# Patient Record
Sex: Male | Born: 1970 | Race: Black or African American | Hispanic: No | Marital: Single | Smoking: Current every day smoker
Health system: Southern US, Community
[De-identification: ages and names within clinical notes are randomized; demographics above are authoritative.]

## PROBLEM LIST (undated history)

## (undated) DIAGNOSIS — I10 Essential (primary) hypertension: Secondary | ICD-10-CM

## (undated) HISTORY — PX: BACK SURGERY: SHX140

---

## 2001-04-26 ENCOUNTER — Inpatient Hospital Stay (HOSPITAL_COMMUNITY): Admission: RE | Admit: 2001-04-26 | Discharge: 2001-04-27 | Payer: Self-pay | Admitting: Neurosurgery

## 2001-04-26 ENCOUNTER — Encounter: Payer: Self-pay | Admitting: Neurosurgery

## 2012-07-19 DIAGNOSIS — S99929A Unspecified injury of unspecified foot, initial encounter: Secondary | ICD-10-CM | POA: Insufficient documentation

## 2012-07-19 DIAGNOSIS — F17200 Nicotine dependence, unspecified, uncomplicated: Secondary | ICD-10-CM | POA: Insufficient documentation

## 2012-07-19 DIAGNOSIS — S8990XA Unspecified injury of unspecified lower leg, initial encounter: Secondary | ICD-10-CM | POA: Insufficient documentation

## 2012-07-19 DIAGNOSIS — Y9289 Other specified places as the place of occurrence of the external cause: Secondary | ICD-10-CM | POA: Insufficient documentation

## 2012-07-19 DIAGNOSIS — X500XXA Overexertion from strenuous movement or load, initial encounter: Secondary | ICD-10-CM | POA: Insufficient documentation

## 2012-07-19 DIAGNOSIS — Y9389 Activity, other specified: Secondary | ICD-10-CM | POA: Insufficient documentation

## 2012-07-20 ENCOUNTER — Emergency Department (HOSPITAL_BASED_OUTPATIENT_CLINIC_OR_DEPARTMENT_OTHER)
Admission: EM | Admit: 2012-07-20 | Discharge: 2012-07-20 | Disposition: A | Discharge status: Home / Self Care | Payer: Worker's Compensation | Attending: Emergency Medicine | Admitting: Emergency Medicine

## 2012-07-20 ENCOUNTER — Emergency Department (HOSPITAL_BASED_OUTPATIENT_CLINIC_OR_DEPARTMENT_OTHER)
Admit: 2012-07-20 | Discharge: 2012-07-20 | Disposition: A | Discharge status: Home / Self Care | Payer: Worker's Compensation | Attending: Emergency Medicine | Admitting: Emergency Medicine

## 2012-07-20 ENCOUNTER — Encounter (HOSPITAL_BASED_OUTPATIENT_CLINIC_OR_DEPARTMENT_OTHER): Payer: Self-pay | Admitting: Emergency Medicine

## 2012-07-20 MED ORDER — OXYCODONE-ACETAMINOPHEN 5-325 MG PO TABS
1.0000 | ORAL_TABLET | Freq: Once | ORAL | Status: AC
  Administered 2012-07-20: 1 via ORAL
  Filled 2012-07-20 (×2): qty 1

## 2012-07-20 MED ORDER — HYDROCODONE-ACETAMINOPHEN 5-325 MG PO TABS: 1.0000 | ORAL_TABLET | Freq: Four times a day (QID) | ORAL | Status: AC | PRN

## 2012-07-20 NOTE — ED Provider Notes (Addendum)
History     CSN: [id number]  Arrival date & time 07/19/12  2359   First MD Initiated Contact with Patient 07/20/12 0127      Chief Complaint  Patient presents with  . Knee Pain    (Consider location/radiation/quality/duration/timing/severity/associated sxs/prior treatment) Patient is a 42 y.o. male presenting with knee pain. The history is provided by the patient. No language interpreter was used.  Knee Pain Location:  Knee Injury: yes   Mechanism of injury comment:  Twist getting out of the truck Knee location:  R knee Pain details:    Quality:  Burning   Radiates to:  Does not radiate   Severity:  Severe   Onset quality:  Gradual   Timing:  Constant   Progression:  Unchanged Chronicity:  New Dislocation: no   Foreign body present:  No foreign bodies Prior injury to area:  No Relieved by:  Nothing Worsened by:  Nothing tried Ineffective treatments:  None tried Associated symptoms: no numbness   Risk factors: no concern for non-accidental trauma     History reviewed. No pertinent past medical history.  History reviewed. No pertinent past surgical history.  No family history on file.  History  Substance Use Topics  . Smoking status: Current Every Day Smoker    Types: Cigars  . Smokeless tobacco: Never Used  . Alcohol Use: Yes     Comment: Occ      Review of Systems  All other systems reviewed and are negative.    Allergies  Review of patient's allergies indicates no known allergies.  Home Medications  No current outpatient prescriptions on file.  BP 146/79  Pulse 73  Temp(Src) 98.3 F (36.8 C) (Oral)  Resp 12  Ht 6\' 2"  (1.88 m)  Wt 250 lb (113.399 kg)  BMI 32.08 kg/m2  SpO2 100%  Physical Exam  Constitutional: He is oriented to person, place, and time. He appears well-developed and well-nourished. No distress.  HENT:  Head: Normocephalic and atraumatic.  Mouth/Throat: Oropharynx is clear and moist.  Eyes: Conjunctivae are normal. Pupils  are equal, round, and reactive to light.  Neck: Normal range of motion. Neck supple.  Cardiovascular: Normal rate, regular rhythm and intact distal pulses.   Pulmonary/Chest: Effort normal. He has no wheezes. He has no rales.  Abdominal: Soft. Bowel sounds are normal. There is no tenderness. There is no rebound and no guarding.  Musculoskeletal: Normal range of motion.  Negative anterior and posterior drawer test of the R knee.  No laxity to varus or valgus stress.  Able to do straight leg raise   Neurological: He is alert and oriented to person, place, and time. He has normal reflexes.  Skin: Skin is warm and dry.  Psychiatric: He has a normal mood and affect.    ED Course  Procedures (including critical care time)  Labs Reviewed - No data to display Dg Knee Complete 4 Views Right  07/20/2012  *RADIOLOGY REPORT*  Clinical Data: Fall, right knee pain  RIGHT KNEE - COMPLETE 4+ VIEW  Comparison: None  Findings: No displaced fracture or dislocation.  No significant degenerative change.  No definite joint effusion.  IMPRESSION: No acute osseous finding right knee.   Original Report Authenticated By: Jearld Lesch, M.D.      No diagnosis found.    MDM  Will treat with immobilizer.  Follow up with orthopedics         April K Palumbo-Rasch, MD 07/20/12 0139  April K Palumbo-Rasch, MD 07/20/12  0143 

## 2012-07-20 NOTE — ED Notes (Signed)
Missed a step getting out of a truck 3 days ago.  Having burning pain behind knee at times and pain with walking.

## 2012-10-27 ENCOUNTER — Other Ambulatory Visit: Payer: Self-pay | Admitting: General Practice

## 2012-10-27 ENCOUNTER — Ambulatory Visit
Admission: RE | Admit: 2012-10-27 | Discharge: 2012-10-27 | Disposition: A | Discharge status: Home / Self Care | Payer: Worker's Compensation | Source: Ambulatory Visit | Attending: General Practice | Admitting: General Practice

## 2012-10-27 DIAGNOSIS — R52 Pain, unspecified: Secondary | ICD-10-CM

## 2012-10-27 DIAGNOSIS — T1490XA Injury, unspecified, initial encounter: Secondary | ICD-10-CM

## 2019-01-10 ENCOUNTER — Emergency Department (HOSPITAL_COMMUNITY): Payer: BLUE CROSS/BLUE SHIELD

## 2019-01-10 ENCOUNTER — Emergency Department (HOSPITAL_COMMUNITY)
Admission: EM | Admit: 2019-01-10 | Discharge: 2019-01-10 | Discharge status: Against Medical Advice | Payer: BLUE CROSS/BLUE SHIELD | Attending: Emergency Medicine | Admitting: Emergency Medicine

## 2019-01-10 ENCOUNTER — Encounter (HOSPITAL_COMMUNITY): Payer: Self-pay

## 2019-01-10 ENCOUNTER — Other Ambulatory Visit: Payer: Self-pay

## 2019-01-10 DIAGNOSIS — Z5321 Procedure and treatment not carried out due to patient leaving prior to being seen by health care provider: Secondary | ICD-10-CM | POA: Insufficient documentation

## 2019-01-10 DIAGNOSIS — R0789 Other chest pain: Secondary | ICD-10-CM | POA: Diagnosis present

## 2019-01-10 DIAGNOSIS — R42 Dizziness and giddiness: Secondary | ICD-10-CM | POA: Insufficient documentation

## 2019-01-10 HISTORY — DX: Essential (primary) hypertension: I10

## 2019-01-10 LAB — BASIC METABOLIC PANEL
Anion gap: 11 (ref 5–15)
BUN: 13 mg/dL (ref 6–20)
CO2: 21 mmol/L — ABNORMAL LOW (ref 22–32)
Calcium: 9.3 mg/dL (ref 8.9–10.3)
Chloride: 104 mmol/L (ref 98–111)
Creatinine, Ser: 0.91 mg/dL (ref 0.61–1.24)
GFR calc Af Amer: >60 mL/min (ref >60)
GFR calc non Af Amer: >60 mL/min (ref >60)
Glucose, Bld: 100 mg/dL — ABNORMAL HIGH (ref 70–99)
Potassium: 4.8 mmol/L (ref 3.5–5.1)
Sodium: 136 mmol/L (ref 135–145)

## 2019-01-10 LAB — CBC
HCT: 40.5 % (ref 39.0–52.0)
Hemoglobin: 13.8 g/dL (ref 13.0–17.0)
MCH: 32.4 pg (ref 26.0–34.0)
MCHC: 34.1 g/dL (ref 30.0–36.0)
MCV: 95.1 fL (ref 80.0–100.0)
Platelets: 278 10*3/uL (ref 150–400)
RBC: 4.26 MIL/uL (ref 4.22–5.81)
RDW: 13 % (ref 11.5–15.5)
WBC: 6.4 10*3/uL (ref 4.0–10.5)
nRBC: 0 % (ref 0.0–0.2)

## 2019-01-10 LAB — TROPONIN I (HIGH SENSITIVITY)
Troponin I (High Sensitivity): 4 ng/L (ref <18)
Troponin I (High Sensitivity): 4 ng/L (ref <18)

## 2019-01-10 IMAGING — DX DG CHEST 2V
2 series · 2 of 2 positions shown · non-contrast
Comparison: None.

CLINICAL DATA: Chest pain

EXAM:
CHEST - 2 VIEW

[chest pa]
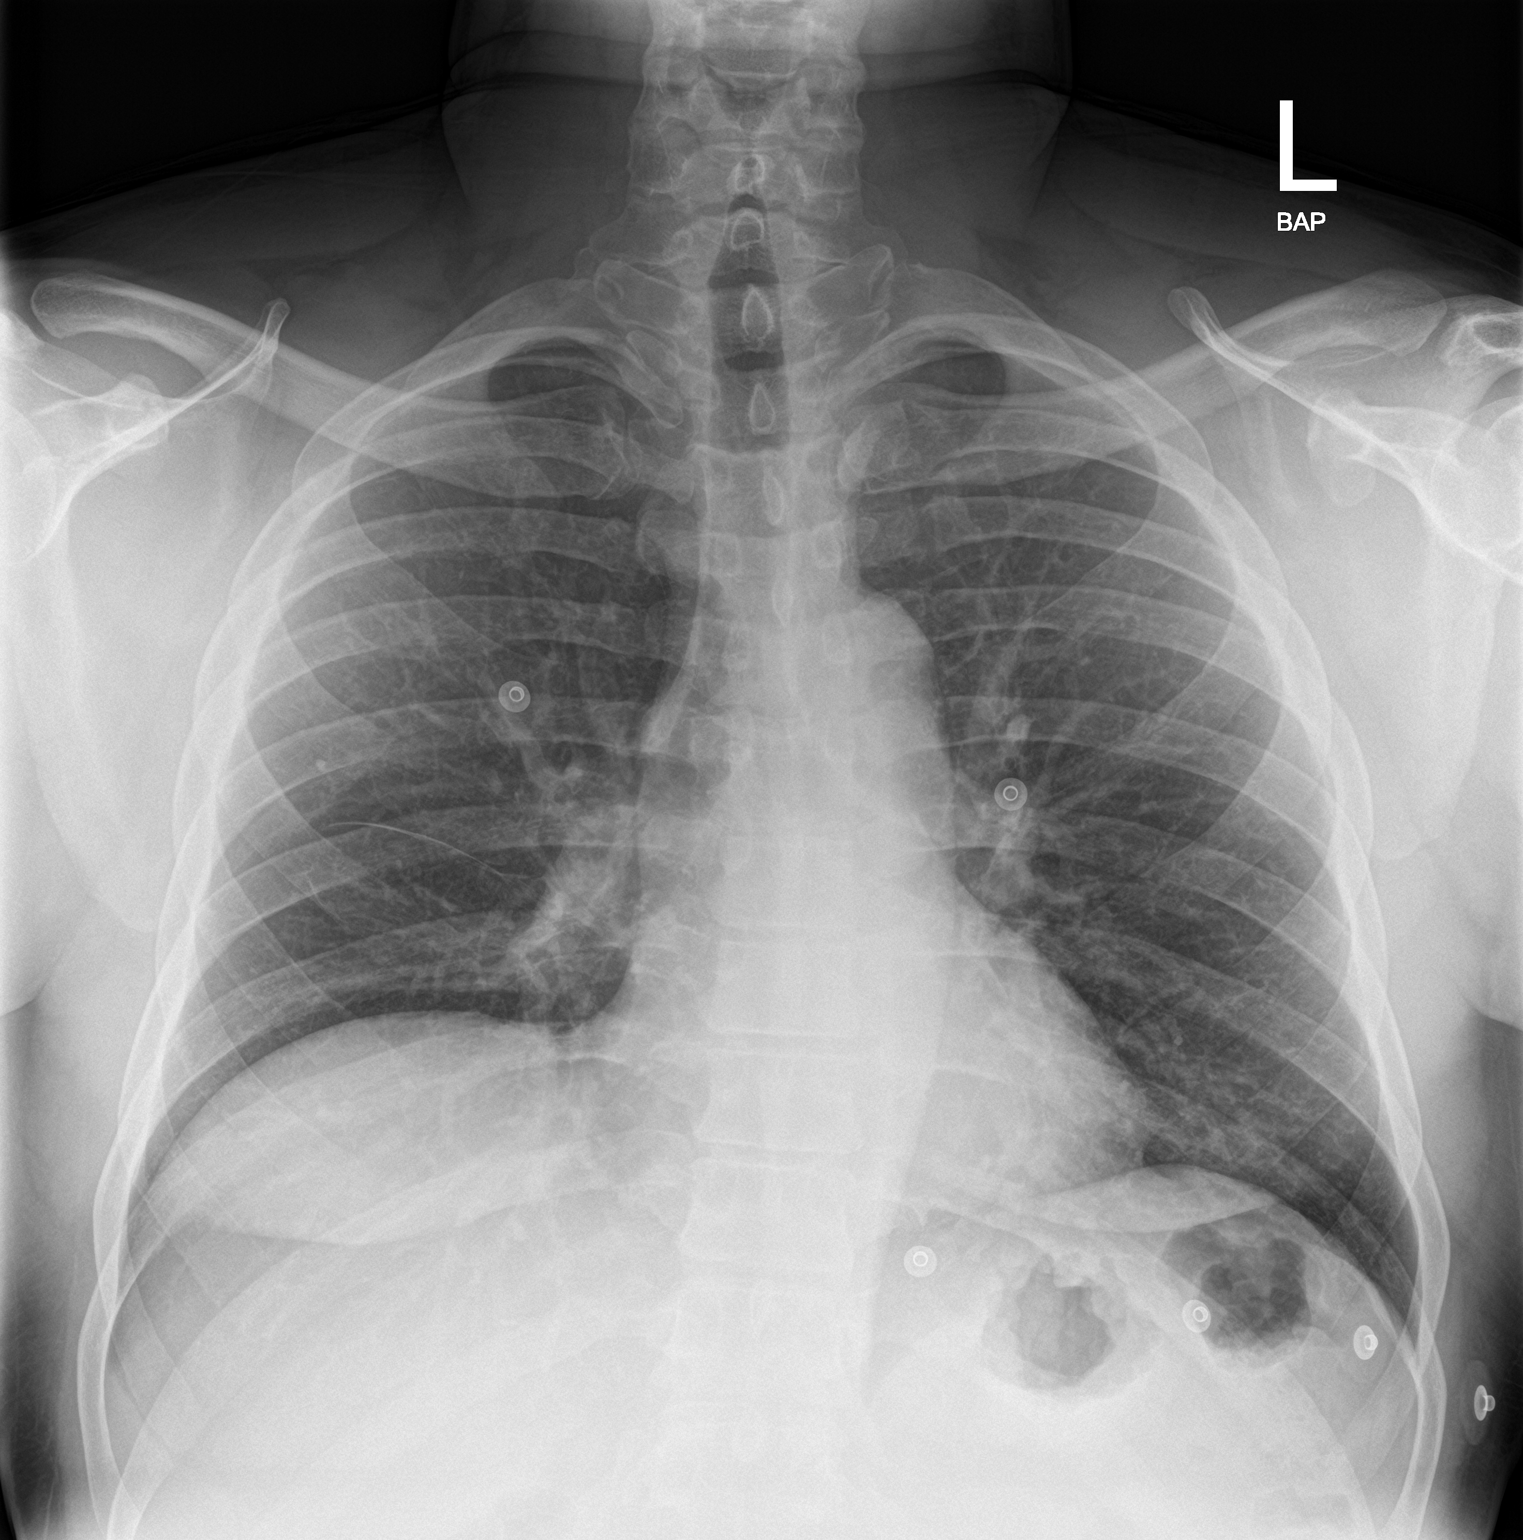

[chest lat]
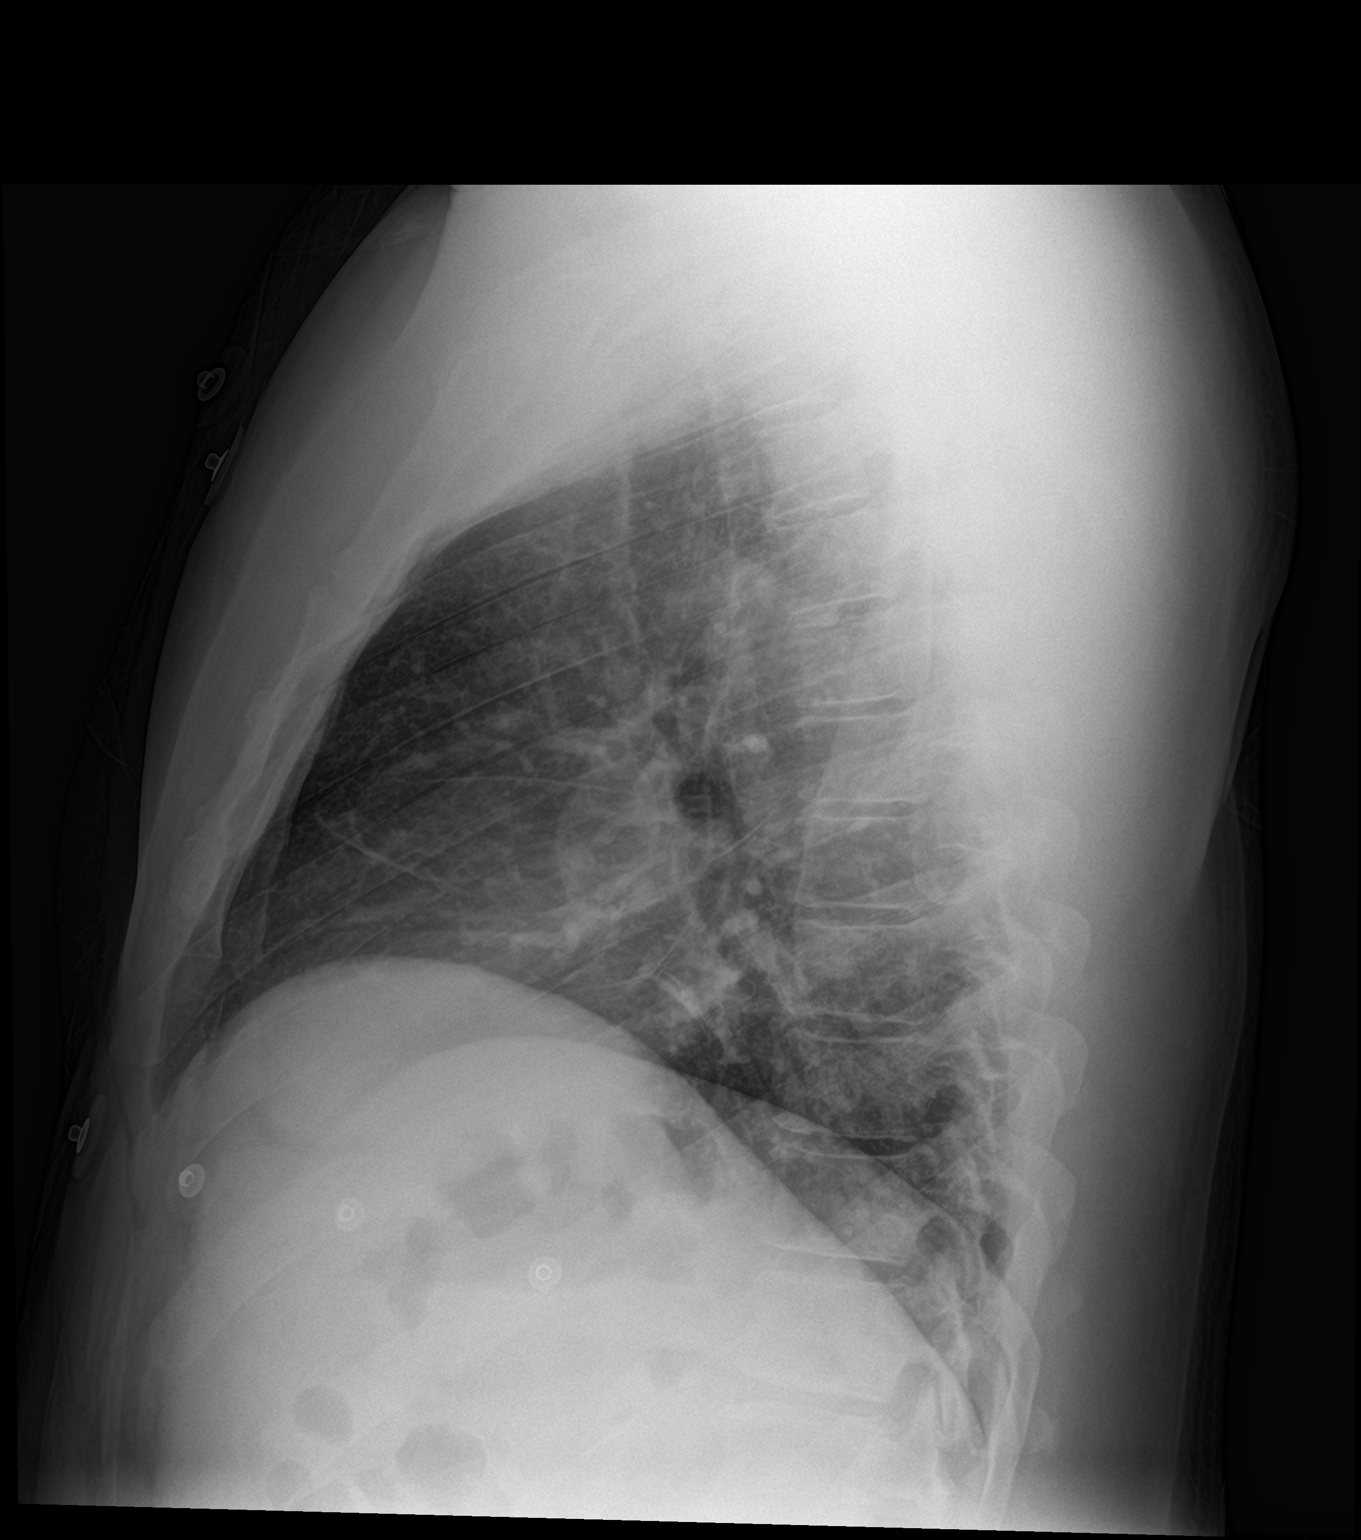

[2 of 2 positions shown; findings below may reference images not displayed]

FINDINGS: The heart size and mediastinal contours are within normal limits.
Both lungs are clear. The visualized skeletal structures are
unremarkable.
IMPRESSION: No acute abnormality of the lungs.

## 2019-01-10 MED ORDER — SODIUM CHLORIDE 0.9% FLUSH: 3.0000 mL | Freq: Once | INTRAVENOUS | Status: DC

## 2019-01-10 NOTE — ED Triage Notes (Signed)
Per [facility] pt was driving and pulled over at a fire station because he started having chest pain and dizziness. Pt felt like his blood pressure increased and he could not grip the steering wheel. Pt has a hx of hypertension and takes 3 different medications. Pt reports medications have not been helping.

## 2019-01-10 NOTE — ED Notes (Signed)
No answer for room 

## 2020-02-07 ENCOUNTER — Ambulatory Visit (INDEPENDENT_AMBULATORY_CARE_PROVIDER_SITE_OTHER): Payer: 59 | Admitting: Podiatry

## 2020-02-07 ENCOUNTER — Ambulatory Visit (INDEPENDENT_AMBULATORY_CARE_PROVIDER_SITE_OTHER): Payer: 59

## 2020-02-07 ENCOUNTER — Other Ambulatory Visit: Payer: Self-pay

## 2020-02-07 DIAGNOSIS — M2012 Hallux valgus (acquired), left foot: Secondary | ICD-10-CM

## 2020-02-07 DIAGNOSIS — Q6689 Other  specified congenital deformities of feet: Secondary | ICD-10-CM | POA: Diagnosis not present

## 2020-02-07 DIAGNOSIS — M7752 Other enthesopathy of left foot: Secondary | ICD-10-CM | POA: Diagnosis not present

## 2020-02-07 NOTE — Progress Notes (Signed)
BU

## 2020-02-07 NOTE — Progress Notes (Signed)
   Subjective: 49 y.o. male presents today as a new patient with his spouse for evaluation of pain to the left foot.  Patient states that he has had a bunion developing on his left foot for several years now.  Is become very symptomatic and he is unable to wear any shoe gear without irritating the area.  He also complains that his left second toe is longer than all the remaining digits.  It is very painful and is close toed shoes because it rubs against all of his shoes.  He does relate a history of metatarsal fractures to the left foot several years prior.  He presents for further treatment and evaluation   Past Medical History:  Diagnosis Date  . Hypertension       Objective: Physical Exam General: The patient is alert and oriented x3 in no acute distress.  Dermatology: Skin is cool, dry and supple bilateral lower extremities. Negative for open lesions or macerations.  Vascular: Palpable pedal pulses bilaterally. No edema or erythema noted. Capillary refill within normal limits.  Neurological: Epicritic and protective threshold grossly intact bilaterally.   Musculoskeletal Exam: Clinical evidence of bunion deformity noted to the respective foot. There is moderate pain on palpation range of motion of the first MPJ. Lateral deviation of the hallux noted consistent with hallux abductovalgus.  Morton's toe noted to the second digit left foot elongated further than the remaining digits  Radiographic Exam: Increased intermetatarsal angle greater than 15 with a hallux abductus angle greater than 30 noted on AP view. Moderate degenerative changes noted within the first MPJ.  Elongated second metatarsal noted extending past the metatarsal parabola as well as an enlarged elongated middle phalanx of the left second toe contributing to the Morton toe deformity  Assessment: 1. HAV w/ bunion deformity left 2.  Morton's toe/elongated second toe left   Plan of Care:  1. Patient was evaluated.  X-Rays reviewed. 2. Today we discussed the conservative versus surgical management of the presenting pathology. The patient opts for surgical management. All possible complications and details of the procedure were explained. All patient questions were answered. No guarantees were expressed or implied. 3. Authorization for surgery was initiated today. Surgery will consist of bunionectomy with first metatarsal osteotomy left.  Weil shortening metatarsal osteotomy second left.  DIPJ arthroplasty second left. 4.  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  Wife's name is Selena Batten who supervises quality control at a biotech company        Felecia Shelling, [facility]  Dr. Felecia Shelling, DPM    [address]                                        [place], [place] [address]                Office [phone]  Fax [phone]

## 2020-02-08 ENCOUNTER — Telehealth: Payer: Self-pay

## 2020-02-08 NOTE — Telephone Encounter (Signed)
DOS 03/01/2020  [place] BUNIONECTOMY LT - 28296 METATARSAL OSTEOTOMY 2ND LT - [id number] HAMMERTOE REPAIR 2ND LT - [id number]  RECEIVED AUTH APPROVAL FAX FROM [facility].   AUTH # [id number] GOOD FROM 03/01/2020 - 05/30/2020

## 2020-02-08 NOTE — Telephone Encounter (Signed)
Received surgery paperwork from the [place] office. Called and left a message for Michael Serrano to call me back so we can schedule surgery.

## 2020-02-10 HISTORY — PX: FOOT FRACTURE SURGERY: SHX645

## 2020-03-01 ENCOUNTER — Other Ambulatory Visit: Payer: Self-pay | Admitting: Podiatry

## 2020-03-01 DIAGNOSIS — M21549 Acquired clubfoot, unspecified foot: Secondary | ICD-10-CM | POA: Diagnosis not present

## 2020-03-01 DIAGNOSIS — M2042 Other hammer toe(s) (acquired), left foot: Secondary | ICD-10-CM

## 2020-03-01 DIAGNOSIS — M2012 Hallux valgus (acquired), left foot: Secondary | ICD-10-CM | POA: Diagnosis not present

## 2020-03-01 MED ORDER — OXYCODONE-ACETAMINOPHEN 5-325 MG PO TABS: 1.0000 | ORAL_TABLET | ORAL | 0 refills | Status: AC | PRN

## 2020-03-01 MED ORDER — IBUPROFEN 800 MG PO TABS: 800.0000 mg | ORAL_TABLET | Freq: Three times a day (TID) | ORAL | 1 refills | Status: AC

## 2020-03-01 NOTE — Progress Notes (Signed)
PRN postop 

## 2020-03-09 ENCOUNTER — Ambulatory Visit (INDEPENDENT_AMBULATORY_CARE_PROVIDER_SITE_OTHER): Payer: 59

## 2020-03-09 ENCOUNTER — Other Ambulatory Visit: Payer: Self-pay

## 2020-03-09 ENCOUNTER — Encounter: Payer: Self-pay | Admitting: Podiatry

## 2020-03-09 ENCOUNTER — Ambulatory Visit (INDEPENDENT_AMBULATORY_CARE_PROVIDER_SITE_OTHER): Payer: 59 | Admitting: Podiatry

## 2020-03-09 VITALS — BP 135/88 | HR 88 | Temp 98.9°F

## 2020-03-09 DIAGNOSIS — M2012 Hallux valgus (acquired), left foot: Secondary | ICD-10-CM

## 2020-03-09 DIAGNOSIS — Z9889 Other specified postprocedural states: Secondary | ICD-10-CM

## 2020-03-09 MED ORDER — DOXYCYCLINE HYCLATE 100 MG PO TABS: 100.0000 mg | ORAL_TABLET | Freq: Two times a day (BID) | ORAL | 0 refills | Status: AC

## 2020-03-09 NOTE — Progress Notes (Signed)
   Subjective:  Patient presents today status post bunionectomy and hammertoe repair second digit left foot. DOS: 03/01/2020.  Patient states that initially he did have some pain with itching because of the pain medication however it is resolved.  Currently he is not taking any pain medication has minimal pain.  He is kept the dressings clean dry and intact and is minimally weightbearing in the cam boot as instructed.  No new complaints at this time  Past Medical History:  Diagnosis Date  . Hypertension       Objective/Physical Exam Neurovascular status intact.  Skin incisions appear to be well coapted with sutures and staples intact. No dehiscence. No active bleeding noted. Moderate edema noted to the surgical extremity.  There are some very mild erythema localized to the forefoot which may be concerning for some mild cellulitis  Radiographic Exam:  Orthopedic hardware and osteotomies sites appear to be stable with routine healing.  Assessment: 1. s/p bunionectomy with DIPJ arthroplasty and Weil shortening osteotomy second digit left. DOS: 03/01/2020   Plan of Care:  1. Patient was evaluated. X-rays reviewed 2.  Prescription for doxycycline 100 mg 2 times daily #20 3.  Dressel dressings applied.  Keep clean dry and intact x1 week.  Minimal weightbearing in the cam boot 4.  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  Wife's name is Selena Batten who supervises quality control at a biotech company   Felecia Shelling, [facility]  Dr. Felecia Shelling, DPM    [address] [place], [place] [address]                Office [phone]  Fax [phone]

## 2020-03-16 ENCOUNTER — Encounter: Payer: Self-pay | Admitting: Podiatry

## 2020-03-16 ENCOUNTER — Ambulatory Visit (INDEPENDENT_AMBULATORY_CARE_PROVIDER_SITE_OTHER): Payer: 59 | Admitting: Podiatry

## 2020-03-16 ENCOUNTER — Other Ambulatory Visit: Payer: Self-pay

## 2020-03-16 DIAGNOSIS — M2012 Hallux valgus (acquired), left foot: Secondary | ICD-10-CM

## 2020-03-16 DIAGNOSIS — Q6689 Other  specified congenital deformities of feet: Secondary | ICD-10-CM

## 2020-03-16 DIAGNOSIS — Z9889 Other specified postprocedural states: Secondary | ICD-10-CM

## 2020-03-16 NOTE — Progress Notes (Signed)
   Subjective:  Patient presents today status post bunionectomy and hammertoe repair second digit left foot. DOS: 03/01/2020.  Patient states that he is feeling well.  No new complaints at this time  Past Medical History:  Diagnosis Date  . Hypertension       Objective/Physical Exam Neurovascular status intact.  Skin incisions appear to be well coapted with sutures intact. No dehiscence. No active bleeding noted. Moderate edema noted to the surgical extremity.  There are some very mild erythema localized to the forefoot which may be concerning for some mild cellulitis   Assessment: 1. s/p bunionectomy with DIPJ arthroplasty and Weil shortening osteotomy second digit left. DOS: 03/01/2020   Plan of Care:  1. Patient was evaluated.  2.  Continue oral doxycycline until completed.   3.  Dressings applied.  Keep clean dry and intact x1 week.  Minimal weightbearing in the cam boot 4.  Return to clinic in 1 week for suture removal.  The patient requested not to have the sutures removed today.  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  Wife's name is Selena Batten who supervises quality control at a biotech company   Felecia Shelling, [facility]  Dr. Felecia Shelling, DPM    [address] [place], [place] [address]                Office [phone]  Fax [phone]

## 2020-03-23 ENCOUNTER — Other Ambulatory Visit: Payer: Self-pay

## 2020-03-23 ENCOUNTER — Ambulatory Visit (INDEPENDENT_AMBULATORY_CARE_PROVIDER_SITE_OTHER): Payer: 59 | Admitting: Podiatry

## 2020-03-23 ENCOUNTER — Ambulatory Visit (INDEPENDENT_AMBULATORY_CARE_PROVIDER_SITE_OTHER): Payer: 59

## 2020-03-23 DIAGNOSIS — M2012 Hallux valgus (acquired), left foot: Secondary | ICD-10-CM

## 2020-03-23 DIAGNOSIS — Z9889 Other specified postprocedural states: Secondary | ICD-10-CM | POA: Diagnosis not present

## 2020-03-23 NOTE — Progress Notes (Signed)
   Subjective:  Patient presents today status post bunionectomy and hammertoe repair second digit left foot. DOS: 03/01/2020.  Patient states that he is feeling well.  No new complaints at this time  Past Medical History:  Diagnosis Date  . Hypertension       Objective/Physical Exam Neurovascular status intact.  Skin incisions appear to be well coapted with sutures intact. No dehiscence. No active bleeding noted. Moderate edema noted to the surgical extremity.  There are some very mild erythema localized to the forefoot which may be concerning for some mild cellulitis   Assessment: 1. s/p bunionectomy with DIPJ arthroplasty and Weil shortening osteotomy second digit left. DOS: 03/01/2020   Plan of Care:  1. Patient was evaluated.  2.  Sutures removed today 3.  Continue weightbearing in the cam boot x2 additional weeks.  Continue Ace wraps daily beginning 04/01/2020 he may begin to transition out of the cam boot into good supportive sneakers 4.  Return to clinic in 2 weeks for follow-up x-ray and to transition the patient out of the cam boot.  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  Wife's name is Selena Batten who supervises quality control at a biotech company   Felecia Shelling, [facility]  Dr. Felecia Shelling, DPM    [address] [place], [place] [address]                Office [phone]  Fax [phone]

## 2020-03-30 ENCOUNTER — Encounter: Payer: 59 | Admitting: Podiatry

## 2020-04-02 ENCOUNTER — Encounter: Payer: Self-pay | Admitting: Emergency Medicine

## 2020-04-02 ENCOUNTER — Emergency Department
Admission: EM | Admit: 2020-04-02 | Discharge: 2020-04-03 | Disposition: A | Discharge status: Home / Self Care | Payer: 59 | Attending: Emergency Medicine | Admitting: Emergency Medicine

## 2020-04-02 ENCOUNTER — Other Ambulatory Visit: Payer: Self-pay

## 2020-04-02 DIAGNOSIS — R1084 Generalized abdominal pain: Secondary | ICD-10-CM | POA: Insufficient documentation

## 2020-04-02 DIAGNOSIS — Z79899 Other long term (current) drug therapy: Secondary | ICD-10-CM | POA: Insufficient documentation

## 2020-04-02 DIAGNOSIS — I10 Essential (primary) hypertension: Secondary | ICD-10-CM | POA: Insufficient documentation

## 2020-04-02 DIAGNOSIS — F17290 Nicotine dependence, other tobacco product, uncomplicated: Secondary | ICD-10-CM | POA: Diagnosis not present

## 2020-04-02 DIAGNOSIS — Z7982 Long term (current) use of aspirin: Secondary | ICD-10-CM | POA: Insufficient documentation

## 2020-04-02 DIAGNOSIS — K922 Gastrointestinal hemorrhage, unspecified: Secondary | ICD-10-CM | POA: Insufficient documentation

## 2020-04-02 DIAGNOSIS — R1013 Epigastric pain: Secondary | ICD-10-CM | POA: Diagnosis present

## 2020-04-02 DIAGNOSIS — F1010 Alcohol abuse, uncomplicated: Secondary | ICD-10-CM | POA: Diagnosis not present

## 2020-04-02 LAB — URINALYSIS, COMPLETE (UACMP) WITH MICROSCOPIC
Bacteria, UA: NONE SEEN
Bilirubin Urine: NEGATIVE
Glucose, UA: NEGATIVE mg/dL
Hgb urine dipstick: NEGATIVE
Ketones, ur: NEGATIVE mg/dL
Leukocytes,Ua: NEGATIVE
Nitrite: NEGATIVE
Protein, ur: NEGATIVE mg/dL
Specific Gravity, Urine: 1.018 (ref 1.005–1.030)
Squamous Epithelial / LPF: NONE SEEN (ref 0–5)
pH: 6 (ref 5.0–8.0)

## 2020-04-02 LAB — COMPREHENSIVE METABOLIC PANEL
ALT: 26 U/L (ref 0–44)
AST: 30 U/L (ref 15–41)
Albumin: 4.1 g/dL (ref 3.5–5.0)
Alkaline Phosphatase: 64 U/L (ref 38–126)
Anion gap: 9 (ref 5–15)
BUN: 16 mg/dL (ref 6–20)
CO2: 25 mmol/L (ref 22–32)
Calcium: 9.3 mg/dL (ref 8.9–10.3)
Chloride: 102 mmol/L (ref 98–111)
Creatinine, Ser: 1.03 mg/dL (ref 0.61–1.24)
GFR, Estimated: >60 mL/min (ref >60)
Glucose, Bld: 99 mg/dL (ref 70–99)
Potassium: 3.7 mmol/L (ref 3.5–5.1)
Sodium: 136 mmol/L (ref 135–145)
Total Bilirubin: 0.9 mg/dL (ref 0.3–1.2)
Total Protein: 7.5 g/dL (ref 6.5–8.1)

## 2020-04-02 LAB — CBC
HCT: 40.5 % (ref 39.0–52.0)
Hemoglobin: 14.1 g/dL (ref 13.0–17.0)
MCH: 31.9 pg (ref 26.0–34.0)
MCHC: 34.8 g/dL (ref 30.0–36.0)
MCV: 91.6 fL (ref 80.0–100.0)
Platelets: 216 10*3/uL (ref 150–400)
RBC: 4.42 MIL/uL (ref 4.22–5.81)
RDW: 12.8 % (ref 11.5–15.5)
WBC: 7.8 10*3/uL (ref 4.0–10.5)
nRBC: 0 % (ref 0.0–0.2)

## 2020-04-02 LAB — LIPASE, BLOOD: Lipase: 35 U/L (ref 11–51)

## 2020-04-02 MED ORDER — PANTOPRAZOLE SODIUM 40 MG IV SOLR
40.0000 mg | Freq: Once | INTRAVENOUS | Status: AC
  Administered 2020-04-02: 40 mg via INTRAVENOUS
  Filled 2020-04-02: qty 40

## 2020-04-02 NOTE — ED Provider Notes (Signed)
[facility] Emergency Department Provider Note  ____________________________________________   First MD Initiated Contact with Patient 04/02/20 2254     (approximate)  I have reviewed the triage vital signs and the nursing notes.   HISTORY  Chief Complaint Abdominal Pain and Flank Pain   HPI Michael Serrano is a 49 y.o. male with a past medical history of hypertension and recent surgery to his left foot 2 months ago subsequently managed with daily NSAID use until last couple days as well as EtOH abuse approximately 3-4 shots and 2 beers per day who presents for assessment of some epigastric discomfort rating to his right flank and was associate with bright red blood per rectum when he went to have a bowel movement.  Patient states he developed a trouble with blood in her some clots.  He denies any prior similar episodes.  No clear alleviating aggravating or precipitating factors.  States he still has some discomfort in his right flank.  Denies any headache, earache, sore throat, nausea, vomiting, chest pain, cough, shortness of breath, rash, extremity pain, urinary symptoms, or recent constipation or diarrhea.  He does think that a couple days ago his stool was very dark.  He is not anticoagulated.         Past Medical History:  Diagnosis Date  . Hypertension     There are no problems to display for this patient.   Past Surgical History:  Procedure Laterality Date  . BACK SURGERY    . FOOT FRACTURE SURGERY  02/2020    Prior to Admission medications   Medication Sig Start Date End Date Taking? Authorizing Provider  ascorbic acid (VITAMIN C) 500 MG tablet Take by mouth.    [provider]  aspirin 81 MG EC tablet Take by mouth.    [provider]  diltiazem (CARDIZEM) 60 MG tablet Take by mouth. 10/29/18   [provider]  doxycycline (VIBRA-TABS) 100 MG tablet Take 1 tablet (100 mg total) by mouth 2 (two) times daily.  03/09/20   Felecia ShellingEvans, Brent M, DPM  hydrochlorothiazide (HYDRODIURIL) 25 MG tablet Take by mouth. 10/12/18   [provider]  HYDROcodone-acetaminophen (NORCO/VICODIN) 5-325 MG per tablet Take 1 tablet by mouth every 6 (six) hours as needed for pain. 07/20/12   Palumbo, April, MD  ibuprofen (ADVIL) 800 MG tablet Take 1 tablet (800 mg total) by mouth 3 (three) times daily. 03/01/20   Felecia ShellingEvans, Brent M, DPM  metoprolol succinate (TOPROL-XL) 25 MG 24 hr tablet Take by mouth. 10/19/18   [provider]  naproxen (NAPROSYN) 500 MG tablet Take by mouth. 06/14/18   [provider]  nitroGLYCERIN (NITROSTAT) 0.4 MG SL tablet Place under the tongue. 09/28/18   [provider]  oxyCODONE-acetaminophen (PERCOCET) 5-325 MG tablet Take 1 tablet by mouth every 4 (four) hours as needed for severe pain. 03/01/20   Felecia ShellingEvans, Brent M, DPM  pantoprazole (PROTONIX) 40 MG tablet Take 1 tablet (40 mg total) by mouth daily. 04/03/20 05/03/20  Gilles ChiquitoSmith, Celso Granja P, MD    Allergies Patient has no known allergies.  No family history on file.  Social History Social History   Tobacco Use  . Smoking status: Current Every Day Smoker    Types: Cigars  . Smokeless tobacco: Never Used  Substance Use Topics  . Alcohol use: Yes    Comment: Occ  . Drug use: No    Review of Systems  Review of Systems  Constitutional: Negative for chills and fever.  HENT: Negative for sore throat.   Eyes: Negative for pain.  Respiratory: Negative for cough and stridor.   Cardiovascular: Negative for chest pain.  Gastrointestinal: Positive for abdominal pain and blood in stool. Negative for vomiting.  Skin: Negative for rash.  Neurological: Negative for seizures, loss of consciousness and headaches.  Psychiatric/Behavioral: Negative for suicidal ideas.  All other systems reviewed and are negative.     ____________________________________________   PHYSICAL EXAM:  VITAL SIGNS: ED Triage Vitals  Enc Vitals  Group     BP 04/02/20 1756 (!) 173/96     Pulse Rate 04/02/20 1751 95     Resp 04/02/20 1751 20     Temp 04/02/20 1751 98.9 F (37.2 C)     Temp Source 04/02/20 1751 Oral     SpO2 04/02/20 1751 100 %     Weight 04/02/20 1753 245 lb (111.1 kg)     Height 04/02/20 1753 6\' 4"  (1.93 m)     Head Circumference --      Peak Flow --      Pain Score 04/02/20 1752 4     Pain Loc --      Pain Edu? --      Excl. in GC? --    Vitals:   04/03/20 0030 04/03/20 0045  BP:    Pulse: 75 76  Resp:    Temp:    SpO2: 99% 99%   Physical Exam Vitals and nursing note reviewed.  Constitutional:      Appearance: He is well-developed.  HENT:     Head: Normocephalic and atraumatic.  Eyes:     Conjunctiva/sclera: Conjunctivae normal.  Cardiovascular:     Rate and Rhythm: Normal rate and regular rhythm.     Heart sounds: No murmur heard.   Pulmonary:     Effort: Pulmonary effort is normal. No respiratory distress.     Breath sounds: Normal breath sounds.  Abdominal:     Palpations: Abdomen is soft.     Tenderness: There is abdominal tenderness in the epigastric area. There is no right CVA tenderness or left CVA tenderness.  Musculoskeletal:     Cervical back: Neck supple.  Skin:    General: Skin is warm and dry.     Capillary Refill: Capillary refill takes less than 2 seconds.  Neurological:     General: No focal deficit present.     Mental Status: He is alert.  Psychiatric:        Mood and Affect: Mood normal.      ____________________________________________   LABS (all labs ordered are listed, but only abnormal results are displayed)  Labs Reviewed  URINALYSIS, COMPLETE (UACMP) WITH MICROSCOPIC - Abnormal; Notable for the following components:      Result Value   Color, Urine YELLOW (*)    APPearance CLEAR (*)    All other components within normal limits  LIPASE, BLOOD  COMPREHENSIVE METABOLIC PANEL  CBC    ____________________________________________  ____________________________________________  RADIOLOGY  ED MD interpretation: No evidence of active arterial bleeding or other acute intra-abdominal pelvic pathology.  Official radiology report(s): CT Angio Abd/Pel W and/or Wo Contrast  Result Date: 04/03/2020 CLINICAL DATA:  GI bleed.  Epigastric pain.  Right flank pain. EXAM: CTA ABDOMEN AND PELVIS WITHOUT AND WITH CONTRAST TECHNIQUE: Multidetector CT imaging of the abdomen and pelvis was performed using the standard protocol during bolus administration of intravenous contrast. Multiplanar reconstructed images and MIPs were obtained and reviewed to evaluate the vascular anatomy. CONTRAST:  OMNIPAQUE IOHEXOL 350 MG/ML SOLN COMPARISON:  None. FINDINGS: VASCULAR Aorta: Normal caliber aorta without aneurysm, dissection, vasculitis or significant stenosis. Atherosclerotic changes are noted. Celiac: Patent without evidence of aneurysm, dissection, vasculitis or significant stenosis. SMA: Patent without evidence of aneurysm, dissection, vasculitis or significant stenosis. Renals: Both renal arteries are patent without evidence of aneurysm, dissection, vasculitis, fibromuscular dysplasia or significant stenosis. IMA: Patent without evidence of aneurysm, dissection, vasculitis or significant stenosis. Inflow: Patent without evidence of aneurysm, dissection, vasculitis or significant stenosis. Proximal Outflow: Bilateral common femoral and visualized portions of the superficial and profunda femoral arteries are patent without evidence of aneurysm, dissection, vasculitis or significant stenosis. Veins: No obvious venous abnormality within the limitations of this arterial phase study. Review of the MIP images confirms the above findings. NON-VASCULAR Lower chest: The lung bases are clear. The heart size is normal. There are coronary artery calcifications. Hepatobiliary: The liver is normal. Normal  gallbladder.There is no biliary ductal dilation. Pancreas: Normal contours without ductal dilatation. No peripancreatic fluid collection. Spleen: Unremarkable. Adrenals/Urinary Tract: --Adrenal glands: Unremarkable. --Right kidney/ureter: No hydronephrosis or radiopaque kidney stones. --Left kidney/ureter: No hydronephrosis or radiopaque kidney stones. --Urinary bladder: Unremarkable. Stomach/Bowel: --Stomach/Duodenum: No hiatal hernia or other gastric abnormality. Normal duodenal course and caliber. --Small bowel: Unremarkable. --Colon: Unremarkable. --Appendix: Normal. Lymphatic: --No retroperitoneal lymphadenopathy. --No mesenteric lymphadenopathy. --No pelvic or inguinal lymphadenopathy. Reproductive: Unremarkable Other: No ascites or free air. There is a fat containing right inguinal hernia. Musculoskeletal. No acute displaced fractures. IMPRESSION: No acute abdominal or pelvic pathology. No findings to explain the patient's GI bleeding. Aortic Atherosclerosis (ICD10-I70.0). Electronically Signed   By: Katherine Mantle M.D.   On: 04/03/2020 00:37    ____________________________________________   PROCEDURES  Procedure(s) performed (including Critical Care):  Procedures   ____________________________________________   INITIAL IMPRESSION / ASSESSMENT AND PLAN / ED COURSE        Patient presents with the above-stated history exam for assessment of some abdominal discomfort associate with some bright red blood per rectum in the toilet bowl earlier today.  Patient is afebrile hemodynamically stable arrival.  ifferential diagnoses include diverticulitis (most common cause) versus hemorrhoids. Less likely etiologies include angiodysplasia, cancer, IBD. Presentation not consistent with mesenteric ischemia or ischemic colitis, brisk or life threatening upper GIB as patient has no evidence of hemorrhagic shock.  No evidence of arterial bleeding or active hemorrhage on CT or other acute  intra-abdominal process.  Labs showed evidence of acute cholestasis, pancreatitis, or significant anemia.  Rectal exam shows no external hemorrhoids or fissures.  Given stable vital signs with reassuring hemoglobin and otherwise reassuring exam and work-up low suspicion for significant bleeding at this time I believe patient safe for discharge plan for close outpatient GI follow-up.  Rx written for Protonix.  Patient counseled extensively on reducing his alcohol intake and avoiding NSAIDs.  Patient voiced understanding of this plan.  Discharged stable condition.  ____________________________________________   FINAL CLINICAL IMPRESSION(S) / ED DIAGNOSES  Final diagnoses:  Generalized abdominal pain  Lower GI bleed  Alcohol abuse    Medications  pantoprazole (PROTONIX) injection 40 mg (40 mg Intravenous Given 04/02/20 2350)  iohexol (OMNIPAQUE) 350 MG/ML injection 100 mL (100 mLs Intravenous Contrast Given 04/03/20 0008)     ED Discharge Orders         Ordered    pantoprazole (PROTONIX) 40 MG tablet  Daily        04/03/20 0109           Note:  This  document was prepared using Conservation officer, historic buildings and may include unintentional dictation errors.   Gilles Chiquito, MD 04/03/20 [phone]

## 2020-04-02 NOTE — ED Triage Notes (Signed)
Pt via POV from home. Pt states that he started having epigastric pain last night that radiates to his R flank. Denies NVD. Denies fever. Denies urinary symptom. Denies CP and SOB. Pt states he no longer has the epigastric pain but does have the pain in his R flank. Pt is A&Ox4 and NAD.

## 2020-04-03 ENCOUNTER — Encounter: Payer: Self-pay | Admitting: Radiology

## 2020-04-03 ENCOUNTER — Emergency Department: Payer: 59

## 2020-04-03 MED ORDER — IOHEXOL 350 MG/ML SOLN
100.0000 mL | Freq: Once | INTRAVENOUS | Status: AC | PRN
  Administered 2020-04-03: 100 mL via INTRAVENOUS

## 2020-04-03 MED ORDER — PANTOPRAZOLE SODIUM 40 MG PO TBEC: 40.0000 mg | DELAYED_RELEASE_TABLET | Freq: Every day | ORAL | 0 refills | Status: AC

## 2020-04-10 ENCOUNTER — Other Ambulatory Visit: Payer: Self-pay

## 2020-04-10 ENCOUNTER — Ambulatory Visit (INDEPENDENT_AMBULATORY_CARE_PROVIDER_SITE_OTHER): Payer: 59

## 2020-04-10 ENCOUNTER — Ambulatory Visit (INDEPENDENT_AMBULATORY_CARE_PROVIDER_SITE_OTHER): Payer: 59 | Admitting: Podiatry

## 2020-04-10 DIAGNOSIS — Z9889 Other specified postprocedural states: Secondary | ICD-10-CM

## 2020-04-10 DIAGNOSIS — M2012 Hallux valgus (acquired), left foot: Secondary | ICD-10-CM

## 2020-04-10 NOTE — Progress Notes (Signed)
   Subjective:  Patient presents today status post bunionectomy and hammertoe repair second digit left foot. DOS: 03/01/2020.  Patient is doing very well.  He has been weightbearing in the cam boot as instructed.  No new complaints at this time  Past Medical History:  Diagnosis Date  . Hypertension       Objective/Physical Exam Neurovascular status intact.  Skin incisions appear to be well coapted and healed. No dehiscence. No active bleeding noted.  There is some residual minimal edema noted to the surgical extremity.    Radiographic exam Osteotomies appear stable and intact.  Orthopedic hardware and screws are intact.  No significant change since prior x-rays  Assessment: 1. s/p bunionectomy with DIPJ arthroplasty and Weil shortening osteotomy second digit left. DOS: 03/01/2020   Plan of Care:  1. Patient was evaluated.  2.  Patient may begin to transition out of the cam boot into good supportive sneakers 3.  Compression ankle sleeve dispensed 4.  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  Wife's name is Selena Batten who supervises quality control at a bi[employer] company   Felecia Shelling, [facility]  Dr. Felecia Shelling, DPM    [address] [place], [place] [address]                Office [phone]  Fax [phone]

## 2020-05-22 ENCOUNTER — Ambulatory Visit (INDEPENDENT_AMBULATORY_CARE_PROVIDER_SITE_OTHER): Payer: 59

## 2020-05-22 ENCOUNTER — Other Ambulatory Visit: Payer: Self-pay

## 2020-05-22 ENCOUNTER — Ambulatory Visit (INDEPENDENT_AMBULATORY_CARE_PROVIDER_SITE_OTHER): Payer: 59 | Admitting: Podiatry

## 2020-05-22 DIAGNOSIS — Z9889 Other specified postprocedural states: Secondary | ICD-10-CM

## 2020-05-22 NOTE — Progress Notes (Signed)
   Subjective:  Patient presents today status post bunionectomy and hammertoe repair second digit left foot. DOS: 03/01/2020.  Patient is doing very well.  He has been weightbearing in mostly crocs slippers.  He states that he cannot get into regular tennis shoes due to the intermittent swelling throughout the day.  No new complaints at this time  Past Medical History:  Diagnosis Date  . Hypertension       Objective/Physical Exam Neurovascular status intact.  Skin incisions appear to be well coapted and healed. No dehiscence. No active bleeding noted.  There is some residual minimal edema noted to the surgical extremity.    Radiographic exam Osteotomies appear stable and intact.  Orthopedic hardware and screws are intact.  No significant change since prior x-rays  Assessment: 1. s/p bunionectomy with DIPJ arthroplasty and Weil shortening osteotomy second digit left. DOS: 03/01/2020   Plan of Care:  1. Patient was evaluated.  X-rays reviewed 2. patient may now resume full activity no restrictions 3.  Continue compression ankle sleeve as needed 4.  Recommend good supportive shoes that do not constrict the toebox 5.  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  Wife's name is Selena Batten who supervises quality control at a biotech company   Felecia Shelling, [facility]  Dr. Felecia Shelling, DPM    [address] [place], [place] [address]                Office [phone]  Fax [phone]

## 2023-04-21 LAB — COLOGUARD: COLOGUARD: POSITIVE — AB

## 2023-06-23 ENCOUNTER — Other Ambulatory Visit: Payer: Self-pay

## 2023-06-23 ENCOUNTER — Encounter (HOSPITAL_BASED_OUTPATIENT_CLINIC_OR_DEPARTMENT_OTHER): Payer: Self-pay

## 2023-06-23 ENCOUNTER — Emergency Department (HOSPITAL_BASED_OUTPATIENT_CLINIC_OR_DEPARTMENT_OTHER): Payer: No Typology Code available for payment source

## 2023-06-23 ENCOUNTER — Emergency Department (HOSPITAL_BASED_OUTPATIENT_CLINIC_OR_DEPARTMENT_OTHER)
Admission: EM | Admit: 2023-06-23 | Discharge: 2023-06-23 | Disposition: A | Discharge status: Home / Self Care | Payer: No Typology Code available for payment source | Attending: Emergency Medicine | Admitting: Emergency Medicine

## 2023-06-23 DIAGNOSIS — K921 Melena: Secondary | ICD-10-CM | POA: Diagnosis present

## 2023-06-23 DIAGNOSIS — Z7982 Long term (current) use of aspirin: Secondary | ICD-10-CM | POA: Insufficient documentation

## 2023-06-23 DIAGNOSIS — Z79899 Other long term (current) drug therapy: Secondary | ICD-10-CM | POA: Diagnosis not present

## 2023-06-23 LAB — URINALYSIS, ROUTINE W REFLEX MICROSCOPIC
Bilirubin Urine: NEGATIVE
Glucose, UA: NEGATIVE mg/dL
Hgb urine dipstick: NEGATIVE
Ketones, ur: 15 mg/dL — AB
Leukocytes,Ua: NEGATIVE
Nitrite: NEGATIVE
Protein, ur: NEGATIVE mg/dL
Specific Gravity, Urine: 1.01 (ref 1.005–1.030)
pH: 6 (ref 5.0–8.0)

## 2023-06-23 LAB — CBC
HCT: 41 % (ref 39.0–52.0)
Hemoglobin: 14.5 g/dL (ref 13.0–17.0)
MCH: 32.6 pg (ref 26.0–34.0)
MCHC: 35.4 g/dL (ref 30.0–36.0)
MCV: 92.1 fL (ref 80.0–100.0)
Platelets: 250 10*3/uL (ref 150–400)
RBC: 4.45 MIL/uL (ref 4.22–5.81)
RDW: 13.7 % (ref 11.5–15.5)
WBC: 9.4 10*3/uL (ref 4.0–10.5)
nRBC: 0 % (ref 0.0–0.2)

## 2023-06-23 LAB — COMPREHENSIVE METABOLIC PANEL
ALT: 15 U/L (ref 0–44)
AST: 21 U/L (ref 15–41)
Albumin: 3.9 g/dL (ref 3.5–5.0)
Alkaline Phosphatase: 61 U/L (ref 38–126)
Anion gap: 9 (ref 5–15)
BUN: 10 mg/dL (ref 6–20)
CO2: 21 mmol/L — ABNORMAL LOW (ref 22–32)
Calcium: 9.1 mg/dL (ref 8.9–10.3)
Chloride: 104 mmol/L (ref 98–111)
Creatinine, Ser: 0.75 mg/dL (ref 0.61–1.24)
GFR, Estimated: >60 mL/min (ref >60)
Glucose, Bld: 111 mg/dL — ABNORMAL HIGH (ref 70–99)
Potassium: 3.6 mmol/L (ref 3.5–5.1)
Sodium: 134 mmol/L — ABNORMAL LOW (ref 135–145)
Total Bilirubin: 0.9 mg/dL (ref 0.0–1.2)
Total Protein: 7.3 g/dL (ref 6.5–8.1)

## 2023-06-23 MED ORDER — IOHEXOL 300 MG/ML  SOLN
100.0000 mL | Freq: Once | INTRAMUSCULAR | Status: AC | PRN
  Administered 2023-06-23: 100 mL via INTRAVENOUS

## 2023-06-23 NOTE — ED Triage Notes (Signed)
Pt reports positive colorgard. Pt reports blood in stool today and had sharp pain in back after he had BM ,now pain decreased

## 2023-06-23 NOTE — Discharge Instructions (Addendum)
Please follow-up with your GI doctors and your primary care doctor.  Try to use stool softeners and make sure that you are not constipated having regular, daily bowel movements.

## 2023-06-23 NOTE — ED Provider Notes (Signed)
[facility] Provider Note   CSN: [id number] Arrival date & time: 06/23/23  1628     History  Chief Complaint  Patient presents with   Blood In Stools    Michael Serrano is a 53 y.o. male presented to ED complaining of sharp abdominal pain, predominantly on the right lower and right upper side, but also noting blood in his stool today.  Denies painful BM.  Reports has had irregular bowel movements for about a month, noting dark stool ongoing for a month.  He has a colonoscopy scheduled next month.  He does not take blood thinning medicine.  Does not take iron pills.  Denies lightheadedness, dizziness, fever or fatigue  HPI     Home Medications Prior to Admission medications   Medication Sig Start Date End Date Taking? Authorizing Provider  ascorbic acid (VITAMIN C) 500 MG tablet Take by mouth.    [provider]  aspirin 81 MG EC tablet Take by mouth.    [provider]  diltiazem (CARDIZEM) 60 MG tablet Take by mouth. 10/29/18   [provider]  doxycycline (VIBRA-TABS) 100 MG tablet Take 1 tablet (100 mg total) by mouth 2 (two) times daily. 03/09/20   Felecia Shelling, DPM  hydrochlorothiazide (HYDRODIURIL) 25 MG tablet Take by mouth. 10/12/18   [provider]  HYDROcodone-acetaminophen (NORCO/VICODIN) 5-325 MG per tablet Take 1 tablet by mouth every 6 (six) hours as needed for pain. 07/20/12   Palumbo, April, MD  ibuprofen (ADVIL) 800 MG tablet Take 1 tablet (800 mg total) by mouth 3 (three) times daily. 03/01/20   Felecia Shelling, DPM  metoprolol succinate (TOPROL-XL) 25 MG 24 hr tablet Take by mouth. 10/19/18   [provider]  naproxen (NAPROSYN) 500 MG tablet Take by mouth. 06/14/18   [provider]  nitroGLYCERIN (NITROSTAT) 0.4 MG SL tablet Place under the tongue. 09/28/18   [provider]  oxyCODONE-acetaminophen (PERCOCET) 5-325 MG tablet Take 1 tablet by mouth every 4  (four) hours as needed for severe pain. 03/01/20   Felecia Shelling, DPM  pantoprazole (PROTONIX) 40 MG tablet Take 1 tablet (40 mg total) by mouth daily. 04/03/20 05/03/20  Gilles Chiquito, MD      Allergies    Patient has no known allergies.    Review of Systems   Review of Systems  Physical Exam Updated Vital Signs BP (!) 141/99 (BP Location: Left Arm)   Pulse (!) 104   Temp 98.9 F (37.2 C)   Resp 18   Wt 104.3 kg   SpO2 100%   BMI 28.00 kg/m  Physical Exam Constitutional:      General: He is not in acute distress. HENT:     Head: Normocephalic and atraumatic.  Eyes:     Conjunctiva/sclera: Conjunctivae normal.     Pupils: Pupils are equal, round, and reactive to light.  Cardiovascular:     Rate and Rhythm: Normal rate and regular rhythm.  Pulmonary:     Effort: Pulmonary effort is normal. No respiratory distress.  Abdominal:     General: There is no distension.     Tenderness: There is no abdominal tenderness. There is no guarding.  Genitourinary:    Comments: Rectal exam was performed with Junior Rimando RN present as chaperone No external or internal hemorrhoids on digital exam, no active hemorrhage or bleeding Skin:    General: Skin is warm and dry.  Neurological:     General:  No focal deficit present.     Mental Status: He is alert. Mental status is at baseline.  Psychiatric:        Mood and Affect: Mood normal.        Behavior: Behavior normal.     ED Results / Procedures / Treatments   Labs (all labs ordered are listed, but only abnormal results are displayed) Labs Reviewed  COMPREHENSIVE METABOLIC PANEL - Abnormal; Notable for the following components:      Result Value   Sodium 134 (*)    CO2 21 (*)    Glucose, Bld 111 (*)    All other components within normal limits  URINALYSIS, ROUTINE W REFLEX MICROSCOPIC - Abnormal; Notable for the following components:   Ketones, ur 15 (*)    All other components within normal limits  CBC  POC OCCULT  BLOOD, ED    EKG None  Radiology CT ABDOMEN PELVIS W CONTRAST Result Date: 06/23/2023 CLINICAL DATA:  Blood in stool EXAM: CT ABDOMEN AND PELVIS WITH CONTRAST TECHNIQUE: Multidetector CT imaging of the abdomen and pelvis was performed using the standard protocol following bolus administration of intravenous contrast. RADIATION DOSE REDUCTION: This exam was performed according to the departmental dose-optimization program which includes automated exposure control, adjustment of the mA and/or kV according to patient size and/or use of iterative reconstruction technique. CONTRAST:  OMNIPAQUE IOHEXOL 300 MG/ML  SOLN COMPARISON:  CT 04/03/2020 FINDINGS: Lower chest: No acute abnormality. Hepatobiliary: No focal liver abnormality is seen. No gallstones, gallbladder wall thickening, or biliary dilatation. Pancreas: Unremarkable. No pancreatic ductal dilatation or surrounding inflammatory changes. Spleen: Normal in size without focal abnormality. Adrenals/Urinary Tract: Adrenal glands are unremarkable. Kidneys are normal, without renal calculi, focal lesion, or hydronephrosis. Bladder is slightly thick walled. Stomach/Bowel: Stomach is within normal limits. Appendix appears normal. No evidence of bowel wall thickening, distention, or inflammatory changes. Vascular/Lymphatic: Aortic atherosclerosis. No enlarged abdominal or pelvic lymph nodes. Reproductive: Prostate is unremarkable. Other: No abdominal wall hernia or abnormality. No abdominopelvic ascites. Musculoskeletal: No acute or significant osseous findings. IMPRESSION: 1. No CT evidence for acute intra-abdominal or pelvic abnormality. 2. Slight bladder wall thickening, correlate with urinalysis to exclude cystitis. 3. Aortic atherosclerosis. Aortic Atherosclerosis (ICD10-I70.0). Electronically Signed   By: Jasmine Pang M.D.   On: 06/23/2023 19:00    Procedures Procedures    Medications Ordered in ED Medications  iohexol (OMNIPAQUE) 300 MG/ML  solution 100 mL (100 mLs Intravenous Contrast Given 06/23/23 1810)    ED Course/ Medical Decision Making/ A&P                                 Medical Decision Making Amount and/or Complexity of Data Reviewed Labs: ordered. Radiology: ordered.  Risk Prescription drug management.   Patient is here with painless blood in his bowel movement but also reporting sharp abdominal pains for several days.  He does not have an acute abdomen on exam.  I reviewed his labs personally, no acute anemia.  Hemoglobin within normal limits.  There is no stool in the rectal vault for testing at this time but the patient does show me a photo on his phone which appears to have dark appearing, melanotic stool.   I think CT imaging is reasonable to evaluate for potential diverticulitis or colitis.  The patient and his wife are in agreement.  CT scan was ordered and personally viewed interpreted, notable for no emergent findings.  There was question of bladder wall thickening on CT but UA does not have evidence of urinary tract infection.  Patient remains hemodynamically stable.  He can follow-up with GI as an outpatient but I do not believe this is a life-threatening GI bleed.  Return precautions were discussed for symptoms of worsening anemia.  The patient and his wife verbalized understanding  Patient is stable for discharge with good follow-up as an outpatient with gastroenterology for colonoscopy next month.        Final Clinical Impression(s) / ED Diagnoses Final diagnoses:  Blood in stool    Rx / DC Orders ED Discharge Orders     None         Amauria Younts, Kermit Balo, MD 06/23/23 1958
